# Patient Record
Sex: Female | Born: 1970 | Race: White | Hispanic: No | Marital: Single | Smoking: Current every day smoker
Health system: Southern US, Community
[De-identification: ages and names within clinical notes are randomized; demographics above are authoritative.]

## PROBLEM LIST (undated history)

## (undated) DIAGNOSIS — S2239XA Fracture of one rib, unspecified side, initial encounter for closed fracture: Secondary | ICD-10-CM

## (undated) DIAGNOSIS — F1910 Other psychoactive substance abuse, uncomplicated: Secondary | ICD-10-CM

## (undated) DIAGNOSIS — F1010 Alcohol abuse, uncomplicated: Secondary | ICD-10-CM

## (undated) DIAGNOSIS — W3400XA Accidental discharge from unspecified firearms or gun, initial encounter: Secondary | ICD-10-CM

## (undated) HISTORY — PX: FACIAL RECONSTRUCTION SURGERY: SHX631

---

## 2000-06-08 ENCOUNTER — Emergency Department (HOSPITAL_COMMUNITY): Admission: EM | Admit: 2000-06-08 | Discharge: 2000-06-08 | Payer: Self-pay | Admitting: Emergency Medicine

## 2000-06-08 ENCOUNTER — Encounter: Payer: Self-pay | Admitting: Emergency Medicine

## 2000-08-27 ENCOUNTER — Encounter: Payer: Self-pay | Admitting: Emergency Medicine

## 2000-08-27 ENCOUNTER — Emergency Department (HOSPITAL_COMMUNITY): Admission: EM | Admit: 2000-08-27 | Discharge: 2000-08-27 | Payer: Self-pay | Admitting: Emergency Medicine

## 2000-08-29 ENCOUNTER — Emergency Department (HOSPITAL_COMMUNITY): Admission: EM | Admit: 2000-08-29 | Discharge: 2000-08-29 | Payer: Self-pay | Admitting: *Deleted

## 2000-12-27 ENCOUNTER — Emergency Department (HOSPITAL_COMMUNITY): Admission: EM | Admit: 2000-12-27 | Discharge: 2000-12-27 | Payer: Self-pay | Admitting: Emergency Medicine

## 2001-02-27 ENCOUNTER — Inpatient Hospital Stay (HOSPITAL_COMMUNITY): Admission: RE | Admit: 2001-02-27 | Discharge: 2001-03-06 | Payer: Self-pay | Admitting: *Deleted

## 2002-03-21 ENCOUNTER — Emergency Department (HOSPITAL_COMMUNITY): Admission: EM | Admit: 2002-03-21 | Discharge: 2002-03-21 | Payer: Self-pay | Admitting: Emergency Medicine

## 2002-03-21 ENCOUNTER — Encounter: Payer: Self-pay | Admitting: Emergency Medicine

## 2002-06-24 ENCOUNTER — Encounter: Payer: Self-pay | Admitting: Internal Medicine

## 2002-06-24 ENCOUNTER — Ambulatory Visit (HOSPITAL_COMMUNITY): Admission: RE | Admit: 2002-06-24 | Discharge: 2002-06-24 | Payer: Self-pay | Admitting: Internal Medicine

## 2002-07-14 ENCOUNTER — Encounter: Payer: Self-pay | Admitting: *Deleted

## 2002-07-14 ENCOUNTER — Emergency Department (HOSPITAL_COMMUNITY): Admission: EM | Admit: 2002-07-14 | Discharge: 2002-07-14 | Payer: Self-pay | Admitting: *Deleted

## 2002-09-23 ENCOUNTER — Emergency Department (HOSPITAL_COMMUNITY): Admission: EM | Admit: 2002-09-23 | Discharge: 2002-09-23 | Payer: Self-pay | Admitting: Emergency Medicine

## 2003-02-28 ENCOUNTER — Emergency Department (HOSPITAL_COMMUNITY): Admission: EM | Admit: 2003-02-28 | Discharge: 2003-02-28 | Payer: Self-pay | Admitting: *Deleted

## 2003-07-21 ENCOUNTER — Emergency Department (HOSPITAL_COMMUNITY): Admission: EM | Admit: 2003-07-21 | Discharge: 2003-07-21 | Payer: Self-pay | Admitting: Emergency Medicine

## 2003-08-08 ENCOUNTER — Emergency Department (HOSPITAL_COMMUNITY): Admission: EM | Admit: 2003-08-08 | Discharge: 2003-08-08 | Payer: Self-pay | Admitting: Emergency Medicine

## 2003-08-17 ENCOUNTER — Emergency Department (HOSPITAL_COMMUNITY): Admission: EM | Admit: 2003-08-17 | Discharge: 2003-08-17 | Payer: Self-pay | Admitting: Emergency Medicine

## 2003-08-27 ENCOUNTER — Emergency Department (HOSPITAL_COMMUNITY): Admission: EM | Admit: 2003-08-27 | Discharge: 2003-08-27 | Payer: Self-pay | Admitting: Emergency Medicine

## 2004-01-06 ENCOUNTER — Emergency Department (HOSPITAL_COMMUNITY): Admission: EM | Admit: 2004-01-06 | Discharge: 2004-01-06 | Payer: Self-pay | Admitting: Emergency Medicine

## 2004-08-21 ENCOUNTER — Emergency Department (HOSPITAL_COMMUNITY): Admission: EM | Admit: 2004-08-21 | Discharge: 2004-08-21 | Payer: Self-pay | Admitting: Emergency Medicine

## 2009-12-22 ENCOUNTER — Ambulatory Visit (HOSPITAL_COMMUNITY)
Admission: RE | Admit: 2009-12-22 | Discharge: 2009-12-22 | Payer: Self-pay | Source: Home / Self Care | Attending: Family Medicine | Admitting: Family Medicine

## 2010-01-05 ENCOUNTER — Ambulatory Visit (HOSPITAL_COMMUNITY)
Admission: RE | Admit: 2010-01-05 | Discharge: 2010-01-05 | Payer: Self-pay | Source: Home / Self Care | Attending: Family Medicine | Admitting: Family Medicine

## 2010-02-27 ENCOUNTER — Emergency Department (HOSPITAL_COMMUNITY)
Admission: EM | Admit: 2010-02-27 | Discharge: 2010-02-27 | Disposition: A | Discharge status: Home / Self Care | Payer: Medicaid Other | Attending: Emergency Medicine | Admitting: Emergency Medicine

## 2010-02-27 DIAGNOSIS — K089 Disorder of teeth and supporting structures, unspecified: Secondary | ICD-10-CM | POA: Insufficient documentation

## 2010-05-20 NOTE — Discharge Summary (Signed)
[facility]  Patient:    Brooke Todd, Brooke Todd Visit Number: [id number] MRN: [id number]          Service Type: PSY Location: 300 [id number] [id number] Attending Physician:  Jeanice Lim Dictated by:   Jeanice Lim, M.D. Admit Date:  02/27/2001 Discharge Date: 03/06/2001                             Discharge Summary  IDENTIFYING DATA:  This is a 40 year old divorced Caucasian female with three children admitted with polysubstance abuse and suicidal thoughts with a plan to overdose.  MEDICATIONS:  None.  ALLERGIES:  PENICILLIN.  PHYSICAL EXAMINATION:  Essentially within normal limits.  Neurologically nonfocal.  LABORATORY DATA:  Routine admission labs essentially within normal limits. Urine drug screen positive for opiates, cocaine, marijuana and alcohol.  MENTAL STATUS EXAMINATION:  Slightly overweight Caucasian female, unkempt, appearing older than stated age.  Reporting auditory hallucinations, hearing the voice of her father.  Mood depressed.  Affect restricted.  Thought process goal directed.  Thought content with voices of her deceased father.  Otherwise no psychotic symptoms or dangerous ideation.  Cognition intact.  ADMISSION DIAGNOSES: Axis I:    1. Major depression, severe with psychotic features.            2. Polysubstance abuse.            3. Benzodiazepine dependence. Axis II:   None. Axis III:  Recurrent urinary tract infection. Axis IV:   Moderate (stressors with primary support group). Axis V:    30/55.  HOSPITAL COURSE:  The patient was admitted and ordered routine p.r.n. medications.  Restarted on Ativan, Lexapro, Restoril and Symmetrel for cocaine cravings.  The patient was placed on a phenobarbital detox protocol and monitored closely for withdrawal symptoms.  The patient tolerated medication changes and detox.  CONDITION ON DISCHARGE:  Markedly improved.  The patients mood was stable. No acute withdrawal symptoms.  No dangerous  ideation or psychotic symptoms. The patient reported motivation to be compliant with follow-up plans.  DISCHARGE MEDICATIONS: 1. Amantadine 100 mg b.i.d. 2. Lexapro 20 mg q.a.m. 3. BuSpar 10 mg q.i.d. 4. Trazodone 100 mg q.h.s.  FOLLOW-UP:  The patient was to follow up with the [facility] IOP.  DISCHARGE DIAGNOSES: Axis I:    1. Major depression, severe with psychotic features.            2. Polysubstance abuse.            3. Benzodiazepine dependence. Axis II:   None. Axis III:  Recurrent urinary tract infection. Axis IV:   Moderate (stressors with primary support group). Axis V:    Global Assessment of Functioning on discharge 55. Dictated by:   Jeanice Lim, M.D. Attending Physician:  Jeanice Lim DD:  04/17/01 TD:  04/18/01 Job: [id number]
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
## 2010-07-13 ENCOUNTER — Encounter: Payer: Self-pay | Admitting: Emergency Medicine

## 2010-07-13 ENCOUNTER — Emergency Department (HOSPITAL_COMMUNITY)
Admission: EM | Admit: 2010-07-13 | Discharge: 2010-07-13 | Disposition: A | Discharge status: Home / Self Care | Payer: Self-pay | Attending: Emergency Medicine | Admitting: Emergency Medicine

## 2010-07-13 DIAGNOSIS — IMO0002 Reserved for concepts with insufficient information to code with codable children: Secondary | ICD-10-CM | POA: Insufficient documentation

## 2010-07-13 MED ORDER — METHOCARBAMOL 500 MG PO TABS: ORAL_TABLET | ORAL | Status: DC

## 2010-07-13 MED ORDER — MELOXICAM 7.5 MG PO TABS: 7.5000 mg | ORAL_TABLET | Freq: Two times a day (BID) | ORAL | Status: DC
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
## 2010-07-19 NOTE — ED Provider Notes (Signed)
Medical screening examination/treatment/procedure(s) were performed by non-physician practitioner and as supervising physician I was immediately available for consultation/collaboration.  Joya Gaskins, MD 07/19/10 [phone]

## 2010-11-26 ENCOUNTER — Emergency Department (HOSPITAL_COMMUNITY)
Admission: EM | Admit: 2010-11-26 | Discharge: 2010-11-26 | Disposition: A | Discharge status: Home / Self Care | Payer: Self-pay | Attending: Emergency Medicine | Admitting: Emergency Medicine

## 2010-11-26 ENCOUNTER — Encounter (HOSPITAL_COMMUNITY): Payer: Self-pay | Admitting: *Deleted

## 2010-11-26 DIAGNOSIS — K0889 Other specified disorders of teeth and supporting structures: Secondary | ICD-10-CM

## 2010-11-26 DIAGNOSIS — K089 Disorder of teeth and supporting structures, unspecified: Secondary | ICD-10-CM | POA: Insufficient documentation

## 2010-11-26 DIAGNOSIS — K029 Dental caries, unspecified: Secondary | ICD-10-CM | POA: Insufficient documentation

## 2010-11-26 MED ORDER — HYDROCODONE-ACETAMINOPHEN 5-325 MG PO TABS: 2.0000 | ORAL_TABLET | ORAL | Status: AC | PRN

## 2010-11-26 MED ORDER — CLINDAMYCIN HCL 150 MG PO CAPS: 150.0000 mg | ORAL_CAPSULE | Freq: Three times a day (TID) | ORAL | Status: AC

## 2010-11-26 NOTE — ED Provider Notes (Signed)
Medical screening examination/treatment/procedure(s) were performed by non-physician practitioner and as supervising physician I was immediately available for consultation/collaboration.   Rolan Bucco, MD 11/26/10 [phone]

## 2010-11-26 NOTE — ED Notes (Signed)
Pt c/o tooth pain to right upper jaw. Pt states she has had pain for long time and has appt to have tooth removed.

## 2010-11-26 NOTE — ED Notes (Signed)
Pt a/ox4. Resp even and unlabored. NAD at this time. D/C instructions and Rx x2 reviewed with pt. Pt verbalized understanding. Pt ambulated to lobby with steady gate.  

## 2010-11-26 NOTE — ED Provider Notes (Signed)
History     CSN: [id number] Arrival date & time: 11/26/2010 10:52 AM   None     Chief Complaint  Patient presents with  . Dental Pain   HPI Brooke Todd is a 40 y.o. female who presents to the ED for dental pain. The pain started a few days ago but she has had the same pain in the past due to loss of a filling in the tooth. The pain is located in the upper right second molar. She rates the pain 8/10.  She has an appointment next week to have the tooth extracted. The history was provided by the patient.  History reviewed. No pertinent past medical history.  History reviewed. No pertinent past surgical history.  No family history on file.  History  Substance Use Topics  . Smoking status: Never Smoker   . Smokeless tobacco: Not on file  . Alcohol Use: No    OB History    Grav Para Term Preterm Abortions TAB SAB Ect Mult Living                  Review of Systems  HENT: Positive for ear pain and dental problem.   All other systems reviewed and are negative.    Allergies  Penicillins  Home Medications   Current Outpatient Rx  Name Route Sig Dispense Refill  . ARIPIPRAZOLE 5 MG PO TABS Oral Take 5 mg by mouth daily.      Marland Kitchen FLUOXETINE HCL 40 MG PO CAPS Oral Take 40 mg by mouth daily.      Marland Kitchen METHOCARBAMOL 500 MG PO TABS  2 tabs po tid for spasm 30 tablet 0    BP 117/84  Pulse 93  Temp 98.9 F (37.2 C)  Resp 18  Ht 5\' 8"  (1.727 m)  Wt 180 lb (81.647 kg)  BMI 27.37 kg/m2  SpO2 98%  LMP 10/30/2010  Physical Exam  Nursing note and vitals reviewed. Constitutional: She is oriented to person, place, and time. She appears well-developed and well-nourished. No distress.  HENT:  Head: Normocephalic.  Right Ear: Tympanic membrane is not erythematous.  Left Ear: Tympanic membrane is not erythematous.  Mouth/Throat: Oropharynx is clear and moist. No oral lesions. Dental caries present. No uvula swelling.    Eyes: EOM are normal.  Neck: Neck supple.    Pulmonary/Chest: Effort normal.  Abdominal: Soft. There is no tenderness.  Musculoskeletal: Normal range of motion.  Lymphadenopathy:    She has no cervical adenopathy.  Neurological: She is alert and oriented to person, place, and time. No cranial nerve deficit.  Skin: Skin is warm and dry.  Psychiatric: She has a normal mood and affect. Her behavior is normal. Judgment and thought content normal.   Assessment: Dental pain  Plan:  Clindamycin 150 mg po every 6 hours   Vicodin   Ibuprofen   Follow up as scheduled with the dentist ED Course  Procedures   MDM          Kerrie Buffalo, NP 11/26/10 1143

## 2011-04-02 ENCOUNTER — Encounter (HOSPITAL_COMMUNITY): Payer: Self-pay | Admitting: *Deleted

## 2011-04-02 ENCOUNTER — Emergency Department (HOSPITAL_COMMUNITY)
Admission: EM | Admit: 2011-04-02 | Discharge: 2011-04-02 | Disposition: A | Discharge status: Home / Self Care | Payer: No Typology Code available for payment source | Attending: Emergency Medicine | Admitting: Emergency Medicine

## 2011-04-02 ENCOUNTER — Emergency Department (HOSPITAL_COMMUNITY): Payer: No Typology Code available for payment source

## 2011-04-02 DIAGNOSIS — R079 Chest pain, unspecified: Secondary | ICD-10-CM | POA: Insufficient documentation

## 2011-04-02 DIAGNOSIS — F17200 Nicotine dependence, unspecified, uncomplicated: Secondary | ICD-10-CM | POA: Insufficient documentation

## 2011-04-02 DIAGNOSIS — R0781 Pleurodynia: Secondary | ICD-10-CM

## 2011-04-02 DIAGNOSIS — Z79899 Other long term (current) drug therapy: Secondary | ICD-10-CM | POA: Insufficient documentation

## 2011-04-02 MED ORDER — HYDROCODONE-ACETAMINOPHEN 5-325 MG PO TABS
1.0000 | ORAL_TABLET | Freq: Once | ORAL | Status: AC
  Administered 2011-04-02: 1 via ORAL
  Filled 2011-04-02: qty 1

## 2011-04-02 MED ORDER — NAPROXEN 500 MG PO TABS: 500.0000 mg | ORAL_TABLET | Freq: Two times a day (BID) | ORAL | Status: DC

## 2011-04-02 MED ORDER — HYDROCODONE-ACETAMINOPHEN 5-325 MG PO TABS: 1.0000 | ORAL_TABLET | Freq: Four times a day (QID) | ORAL | Status: AC | PRN
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
## 2011-05-17 ENCOUNTER — Emergency Department (HOSPITAL_COMMUNITY): Payer: Self-pay

## 2011-05-17 ENCOUNTER — Encounter (HOSPITAL_COMMUNITY): Payer: Self-pay

## 2011-05-17 ENCOUNTER — Emergency Department (HOSPITAL_COMMUNITY)
Admission: EM | Admit: 2011-05-17 | Discharge: 2011-05-17 | Disposition: A | Discharge status: Home / Self Care | Payer: Self-pay | Attending: Emergency Medicine | Admitting: Emergency Medicine

## 2011-05-17 DIAGNOSIS — IMO0001 Reserved for inherently not codable concepts without codable children: Secondary | ICD-10-CM | POA: Insufficient documentation

## 2011-05-17 DIAGNOSIS — R05 Cough: Secondary | ICD-10-CM | POA: Insufficient documentation

## 2011-05-17 DIAGNOSIS — J3489 Other specified disorders of nose and nasal sinuses: Secondary | ICD-10-CM | POA: Insufficient documentation

## 2011-05-17 DIAGNOSIS — R059 Cough, unspecified: Secondary | ICD-10-CM | POA: Insufficient documentation

## 2011-05-17 DIAGNOSIS — J029 Acute pharyngitis, unspecified: Secondary | ICD-10-CM | POA: Insufficient documentation

## 2011-05-17 DIAGNOSIS — J40 Bronchitis, not specified as acute or chronic: Secondary | ICD-10-CM | POA: Insufficient documentation

## 2011-05-17 DIAGNOSIS — R079 Chest pain, unspecified: Secondary | ICD-10-CM | POA: Insufficient documentation

## 2011-05-17 DIAGNOSIS — F17200 Nicotine dependence, unspecified, uncomplicated: Secondary | ICD-10-CM | POA: Insufficient documentation

## 2011-05-17 MED ORDER — AZITHROMYCIN 250 MG PO TABS: ORAL_TABLET | ORAL | Status: DC

## 2011-05-17 MED ORDER — PROMETHAZINE-CODEINE 6.25-10 MG/5ML PO SYRP: 5.0000 mL | ORAL_SOLUTION | ORAL | Status: AC | PRN

## 2011-05-17 NOTE — ED Notes (Signed)
Complain of cough and congestion. Also, states her right ribs hurt where she has been coughing so much
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
## 2011-05-17 NOTE — Discharge Instructions (Signed)
Bronchitis Bronchitis is a problem of the air tubes leading to your lungs. This problem makes it hard for air to get in and out of the lungs. You may cough a lot because your air tubes are narrow. Going without care can cause lasting (chronic) bronchitis. HOME CARE   Drink enough fluids to keep your pee (urine) clear or pale yellow.   Use a cool mist humidifier.   Quit smoking if you smoke. If you keep smoking, the bronchitis might not get better.   Only take medicine as told by your doctor.  GET HELP RIGHT AWAY IF:   Coughing keeps you awake.   You start to wheeze.   You become more sick or weak.   You have a hard time breathing or get short of breath.   You cough up blood.   Coughing lasts more than 2 weeks.   You have a fever.   Your baby is older than 3 months with a rectal temperature of 102 F (38.9 C) or higher.   Your baby is 8 months old or younger with a rectal temperature of 100.4 F (38 C) or higher.  MAKE SURE YOU:  Understand these instructions.   Will watch your condition.   Will get help right away if you are not doing well or get worse.  Document Released: 06/07/2007 Document Revised: 12/08/2010 Document Reviewed: 11/20/2008 [facility] Patient Information 2012 [place], [place].   Take the entire course of the antibiotic prescribed.  You may use the Phenergan with codeine if needed for cough relief, and this medication will make you drowsy do not drive within 4 hours of taking.  This should also help improve your pain with coughing as well.  Your chest x-ray is negative today for pneumonia.  You need to stop smoking.

## 2011-05-22 NOTE — ED Provider Notes (Signed)
Medical screening examination/treatment/procedure(s) were performed by non-physician practitioner and as supervising physician I was immediately available for consultation/collaboration.   Benny Lennert, MD 05/22/11 [phone]

## 2011-06-07 ENCOUNTER — Encounter (HOSPITAL_COMMUNITY): Payer: Self-pay

## 2011-06-07 ENCOUNTER — Emergency Department (HOSPITAL_COMMUNITY)
Admission: EM | Admit: 2011-06-07 | Discharge: 2011-06-07 | Disposition: A | Discharge status: Home / Self Care | Payer: Self-pay | Attending: Emergency Medicine | Admitting: Emergency Medicine

## 2011-06-07 DIAGNOSIS — F1910 Other psychoactive substance abuse, uncomplicated: Secondary | ICD-10-CM | POA: Insufficient documentation

## 2011-06-07 DIAGNOSIS — F1010 Alcohol abuse, uncomplicated: Secondary | ICD-10-CM | POA: Insufficient documentation

## 2011-06-07 DIAGNOSIS — F17200 Nicotine dependence, unspecified, uncomplicated: Secondary | ICD-10-CM | POA: Insufficient documentation

## 2011-06-07 HISTORY — DX: Accidental discharge from unspecified firearms or gun, initial encounter: W34.00XA

## 2011-06-07 HISTORY — DX: Other psychoactive substance abuse, uncomplicated: F19.10

## 2011-06-07 HISTORY — DX: Alcohol abuse, uncomplicated: F10.10

## 2011-06-07 HISTORY — DX: Fracture of one rib, unspecified side, initial encounter for closed fracture: S22.39XA

## 2011-06-07 LAB — CBC
MCV: 89.4 fL (ref 78.0–100.0)
RBC: 4.91 MIL/uL (ref 3.87–5.11)
RDW: 14.6 % (ref 11.5–15.5)
WBC: 9.9 10*3/uL (ref 4.0–10.5)

## 2011-06-07 LAB — COMPREHENSIVE METABOLIC PANEL
Albumin: 3.7 g/dL (ref 3.5–5.2)
Alkaline Phosphatase: 77 U/L (ref 39–117)
BUN: 6 mg/dL (ref 6–23)
CO2: 25 mEq/L (ref 19–32)
Creatinine, Ser: 0.81 mg/dL (ref 0.50–1.10)
GFR calc Af Amer: >90 mL/min (ref >90)

## 2011-06-07 LAB — DIFFERENTIAL
Basophils Absolute: 0 10*3/uL (ref 0.0–0.1)
Basophils Relative: 0 % (ref 0–1)

## 2011-06-07 LAB — RAPID URINE DRUG SCREEN, HOSP PERFORMED
Amphetamines: NOT DETECTED
Barbiturates: NOT DETECTED
Opiates: NOT DETECTED
Tetrahydrocannabinol: POSITIVE — AB

## 2011-06-07 NOTE — Discharge Instructions (Signed)
Read the information below.  Please use the resources available to you for help with your drug use.  If you feel that you would like help or feel that you are a danger to yourself or anyone else, please call 911 or return to the ER immediately.  You may return to the ER at any time for worsening condition or any new symptoms that concern you.   Polysubstance Abuse When people abuse more than one drug or type of drug it is called polysubstance or polydrug abuse. For example, many smokers also drink alcohol. This is one form of polydrug abuse. Polydrug abuse also refers to the use of a drug to counteract an unpleasant effect produced by another drug. It may also be used to help with withdrawal from another drug. People who take stimulants may become agitated. Sometimes this agitation is countered with a tranquilizer. This helps protect against the unpleasant side effects. Polydrug abuse also refers to the use of different drugs at the same time.  Anytime drug use is interfering with normal living activities, it has become abuse. This includes problems with family and friends. Psychological dependence has developed when your mind tells you that the drug is needed. This is usually followed by physical dependence which has developed when continuing increases of drug are required to get the same feeling or "high". This is known as addiction or chemical dependency. A person's risk is much higher if there is a history of chemical dependency in the family. SIGNS OF CHEMICAL DEPENDENCY  You have been told by friends or family that drugs have become a problem.   You fight when using drugs.   You are having blackouts (not remembering what you do while using).   You feel sick from using drugs but continue using.   You lie about use or amounts of drugs (chemicals) used.   You need chemicals to get you going.   You are suffering in work performance or in school because of drug use.   You get sick from use of  drugs but continue to use anyway.   You need drugs to relate to people or feel comfortable in social situations.   You use drugs to forget problems.  "Yes" answered to any of the above signs of chemical dependency indicates there are problems. The longer the use of drugs continues, the greater the problems will become. If there is a family history of drug or alcohol use, it is best not to experiment with these drugs. Continual use leads to tolerance. After tolerance develops more of the drug is needed to get the same feeling. This is followed by addiction. With addiction, drugs become the most important part of life. It becomes more important to take drugs than participate in the other usual activities of life. This includes relating to friends and family. Addiction is followed by dependency. Dependency is a condition where drugs are now needed not just to get high, but to feel normal. Addiction cannot be cured but it can be stopped. This often requires outside help and the care of professionals. Treatment centers are listed in the yellow pages under: Cocaine, Narcotics, and Alcoholics Anonymous. Most hospitals and clinics can refer you to a specialized care center. Talk to your caregiver if you need help. Document Released: 08/10/2004 Document Revised: 12/08/2010 Document Reviewed: 12/19/2004 [facility] Patient Information 2012 [place], [place].  If you have no primary doctor, here are some resources that may be helpful:  Medicaid-accepting [facility] Providers:   -  [facility]- [address]      [phone]      Mon-Fri 9am-7pm, Sat 9am-1pm   - [facility]- [address] [place], [place]      [phone]   - [facility]- [address] [place]      [phone]   [facility]- [address]      [phone]   - Renaye Rakers- [address] [place], [address]      [phone]      Only accepts [place] patients       after they have her name applied to their card   Self Pay (no insurance) in [place]:   - Sickle Cell Patients: Dr Willey Blade, [facility]      [address] [place]      [phone]   - [phone]   - Physician Referral Service- [phone]   - [facility]- [address] [place]      [phone]   [facility] [place]- [address]   - [facility]- see information above      (Speak to [employer] if you do not have insurance)   - [facility]- [address] [place]      [phone]   - [facility] [place]- [address]      [phone]   - [facility]- [address]      [phone]   - Dr Julio Sicks-  [address], [place]      [phone]   - [facility]- [address]      [phone]   - [facility]- [address]      [phone]      Also [address]      [phone]   - [facility]- [address]      [phone]      1st and 3rd Saturday every month, 10am-1pm Other agencies that provide inexpensive medical care:    [facility]  [phone]    [facility]  [phone]    [facility]  [phone]    [facility]  [phone]    [facility]  [phone]  General Information: Finding a doctor when you do not have health insurance can be tricky. Although you are not limited by an insurance plan, you are of course limited by her finances and how much but he can pay out of pocket.  What are your options if you don't have health insurance?   1) Find a Librarian, academic and Pay Out of Pocket Although you won't have to find out who is covered by your insurance plan, it is a good idea to ask around and get recommendations. You will then need to call the office and see if the doctor you have chosen will accept you as a new patient and what types of options they offer for  patients who are self-pay. Some doctors offer discounts or will set up payment plans for their patients who do not have insurance, but you will need to ask so you aren't surprised when you get to your appointment.  2) Contact Your Local Health Department Not all health departments have doctors that can see patients for sick visits, but  many do, so it is worth a call to see if yours does. If you don't know where your local health department is, you can check in your phone book. The CDC also has a tool to help you locate your state's health department, and many state websites also have listings of all of their local health departments.  3) Find a Walk-in Clinic If your illness is not likely to be very severe or complicated, you may want to try a walk in clinic. These are popping up all over the country in pharmacies, drugstores, and shopping centers. They're usually staffed by nurse practitioners or physician assistants that have been trained to treat common illnesses and complaints. They're usually fairly quick and inexpensive. However, if you have serious medical issues or chronic medical problems, these are probably not your best option   RESOURCE GUIDE  Chronic Pain Problems: Contact [facility]  [phone] Patients need to be referred by their primary care doctor.  Insufficient Money for Medicine: Contact United Way:  call "211" or [facility] [phone].  No Primary Care Doctor: - Call [facility] Connect  [phone] - can help you locate a primary care doctor that  accepts your insurance, provides certain services, etc. - Physician Referral Service- [phone]  Agencies that provide inexpensive medical care: - [facility]  [phone] - [facility]  [phone] - [facility]  [phone] - Women's Clinic  [phone] - [facility]  [phone] [facility]  [phone]  Medicaid-accepting [facility]  Providers: - [facility]- [address]  [phone], Mon-Fri 9am-7pm, Sat 9am-1pm - [facility]- [address] [place], [address] [place]  [phone] - [facility]- [address], [address] [place]  [phone] [facility]- [address]  [phone] - Renaye Rakers- [address] [place], [address], [phone]  Only accepts [place] patients after they have their name  applied to their card  Self Pay (no insurance) in [place]: - Sickle Cell Patients: Dr Willey Blade, [facility]  [address] [place], [phone] - [facility]- [address] [place]  [phone]       [facility] [place]- [address]       -     [facility]- see information above (Speak to [employer] if you do not have insurance)       -  [facility]- [address] [place], [phone]       -  [facility]- [address],  [phone]       -  [facility]- [address], [phone]       -  Dr Julio Sicks-  [address], [place], [phone]       -  [facility]- [address], [phone]       -  [facility]- [address], [phone], also [address] [phone]       -    [facility]- [address] [place], [phone], 1st & 3rd Saturday   every month, 10am-1pm  1) Find a Doctor and Pay Out of Pocket Although you won't have to find out who is covered by your insurance plan, it is a good idea to ask  around and get recommendations. You will then need to call the office and see if the doctor you have chosen will accept you as a new patient and what types of options they offer for patients who are self-pay. Some doctors offer discounts or will set up payment plans for their patients who do not have insurance, but you will need to ask so you aren't surprised when you get to your  appointment.  2) Contact Your Local Health Department Not all health departments have doctors that can see patients for sick visits, but many do, so it is worth a call to see if yours does. If you don't know where your local health department is, you can check in your phone book. The CDC also has a tool to help you locate your state's health department, and many state websites also have listings of all of their local health departments.  3) Find a Walk-in Clinic If your illness is not likely to be very severe or complicated, you may want to try a walk in clinic. These are popping up all over the country in pharmacies, drugstores, and shopping centers. They're usually staffed by nurse practitioners or physician assistants that have been trained to treat common illnesses and complaints. They're usually fairly quick and inexpensive. However, if you have serious medical issues or chronic medical problems, these are probably not your best option  STD Testing - [facility] Department of [facility], STD Clinic, [address] [place], phone [phone] or [phone].  Monday - Friday, call for an appointment. [facility] Department of [employer], STD Clinic, [address], [place], phone [phone] or [phone].  Monday - Friday, call for an appointment.  Abuse/Neglect: [facility] Child Abuse Hotline 848-588-4710 [facility] Child Abuse Hotline 916-458-3996 (After Hours)  Emergency Shelter:  [facility] 907-133-8635  Maternity Homes: - [facility] (709)[phone] - [facility] [phone]  MRSA Hotline #:   505-246-8767  [facility] Resources  [facility]  [employer] [facility] Dept. [address]                 [address]         [address] [place] [address]  Blondell Reveal Phone:  [phone]                                  Phone:  [phone]                   Phone:  [phone]  [facility] Mental Health, [phone] - [phone]       -     [facility] in [place], [address],  [phone], [facility] Child Abuse Hotline (938)784-0524 or 785-045-1492 (After Hours)   Behavioral Health Services  Substance Abuse Resources: - Alcohol and Drug Services  ([phone] - [facility] [phone] - [facility] [place] [phone] Floydene Flock [phone] - Residential & Outpatient Substance Abuse Program  [phone]  Psychological Services: [facility]  [phone] [facility] Services  [phone] - [facility], [phone] [place]. [address] [place], ACCESS LINE: 773-367-2896 or (269)[phone], [employer]  Dental Assistance  If unable to pay or uninsured, contact:  [facility] or [facility]. to become qualified for the adult dental clinic.  Patients with Medicaid: [facility] [phone] [address], [phone] [address], [phone]  If unable to pay, or uninsured, contact [facility] 5811984872) or [facility] Department [phone] in [place], [phone] in [facility]) to become qualified for the adult dental clinic  Other Low-Cost Community Dental Services: - [facility]- [address] [place], [place], [place], [address], [phone], [address], 2nd and 4th Thursday of the month at 6:30am.  10 clients each day by appointment, can sometimes see walk-in patients if someone does not show for an appointment. [facility]- [address] [place], [place], [address], [phone] - [facility]- [address], [place], [place], [address],  [phone] - [facility]- [phone] [facility]- [phone] [facility] Department- [phone]
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
## 2011-06-07 NOTE — ED Notes (Signed)
Pt. has 3 bags of belongings located on top of pt. lockers in triage

## 2011-06-07 NOTE — ED Notes (Signed)
Pt. and belongings both wanded by security 

## 2011-06-07 NOTE — ED Notes (Signed)
Patient reports that she has been feeling suicidal for the past 3-4 months.  Patient has red marks on bilateral arms. Patient states that she only attempts to cut herself when she argues with her boyfriend to get him to shut up.. patient denies trying to commit suicide in the past. paitent also admits to using crack. Patient last used crack 1 1/2 hours ago and last drank a 40 ounce beer prior to coming to the ED.

## 2011-06-07 NOTE — ED Notes (Signed)
Pt. in blue scrubs and red socks.  

## 2011-06-08 NOTE — ED Provider Notes (Signed)
Medical screening examination/treatment/procedure(s) were performed by non-physician practitioner and as supervising physician I was immediately available for consultation/collaboration.  Flint Melter, MD 06/08/11 [phone]

## 2011-09-23 IMAGING — CR DG THORACIC SPINE 2V
2 series · 2 of 2 positions shown · non-contrast
Comparison: None.

CLINICAL DATA: Back pain.  No injury.

THORACIC SPINE - 2 VIEW

[view not recorded (1 of 2)]
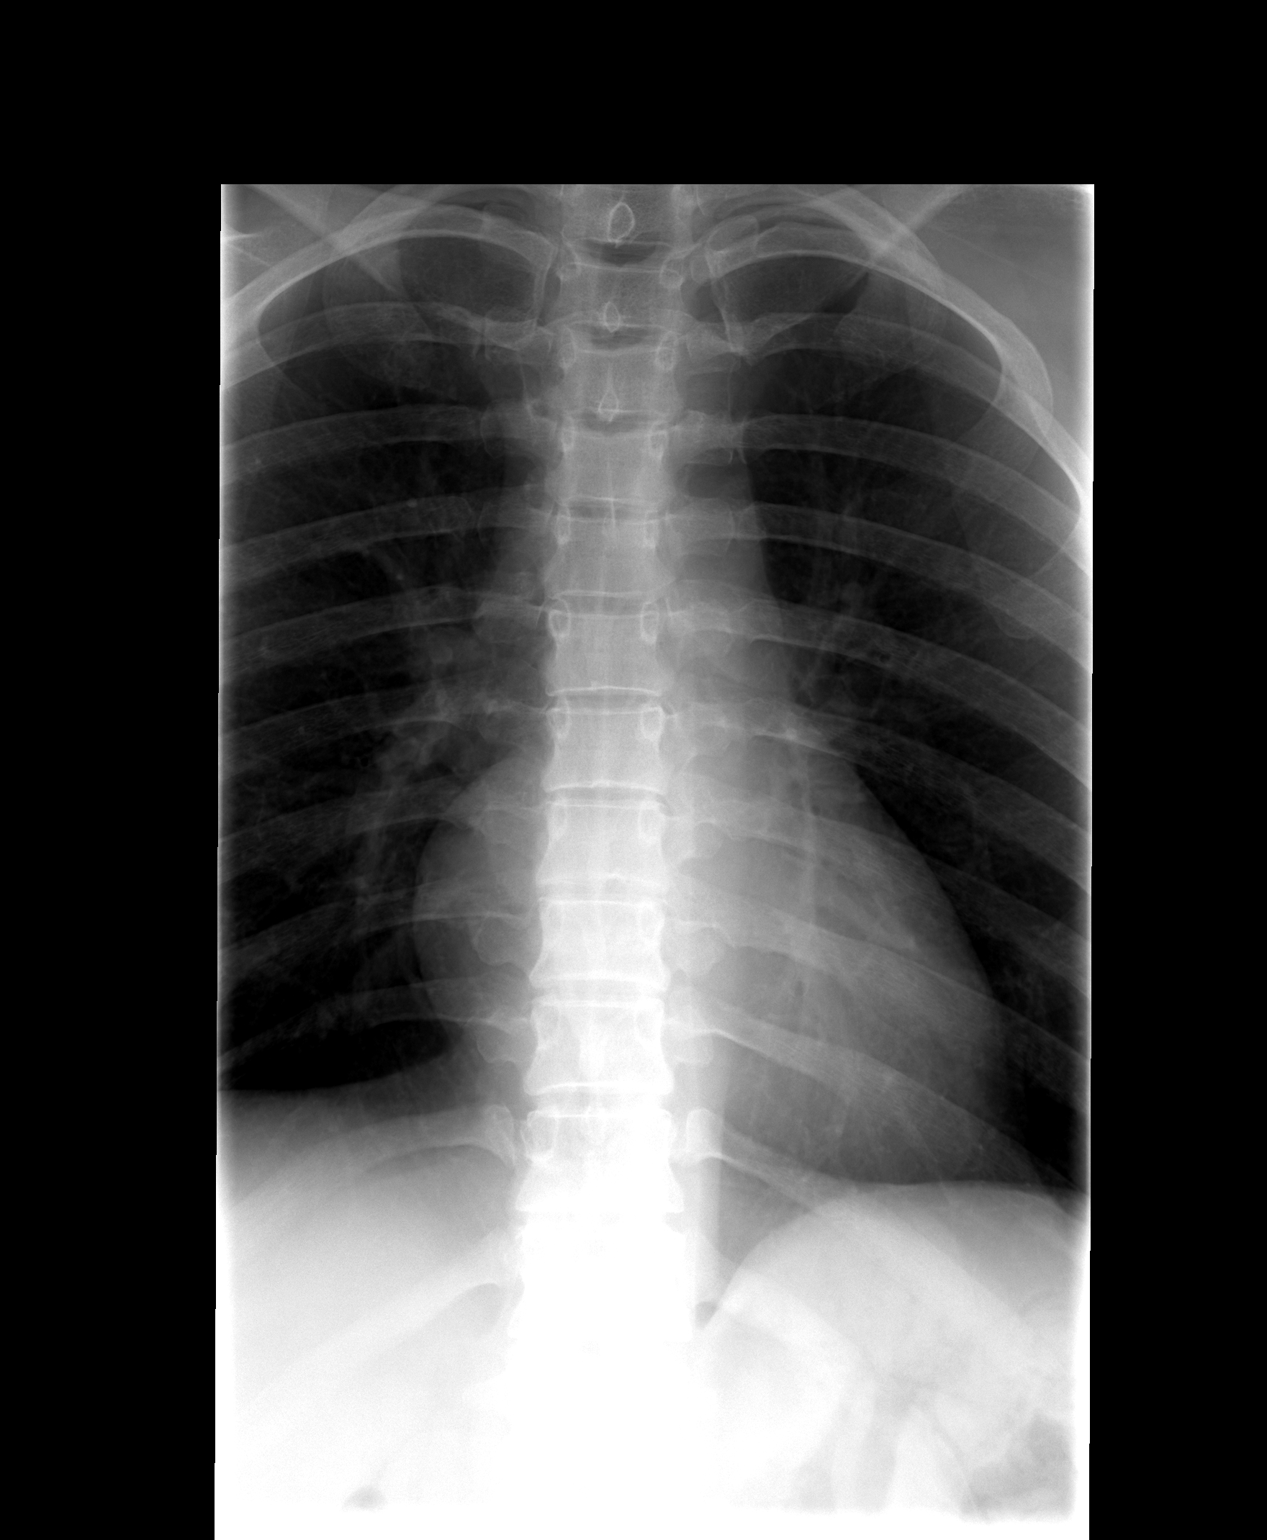

[view not recorded (2 of 2)]
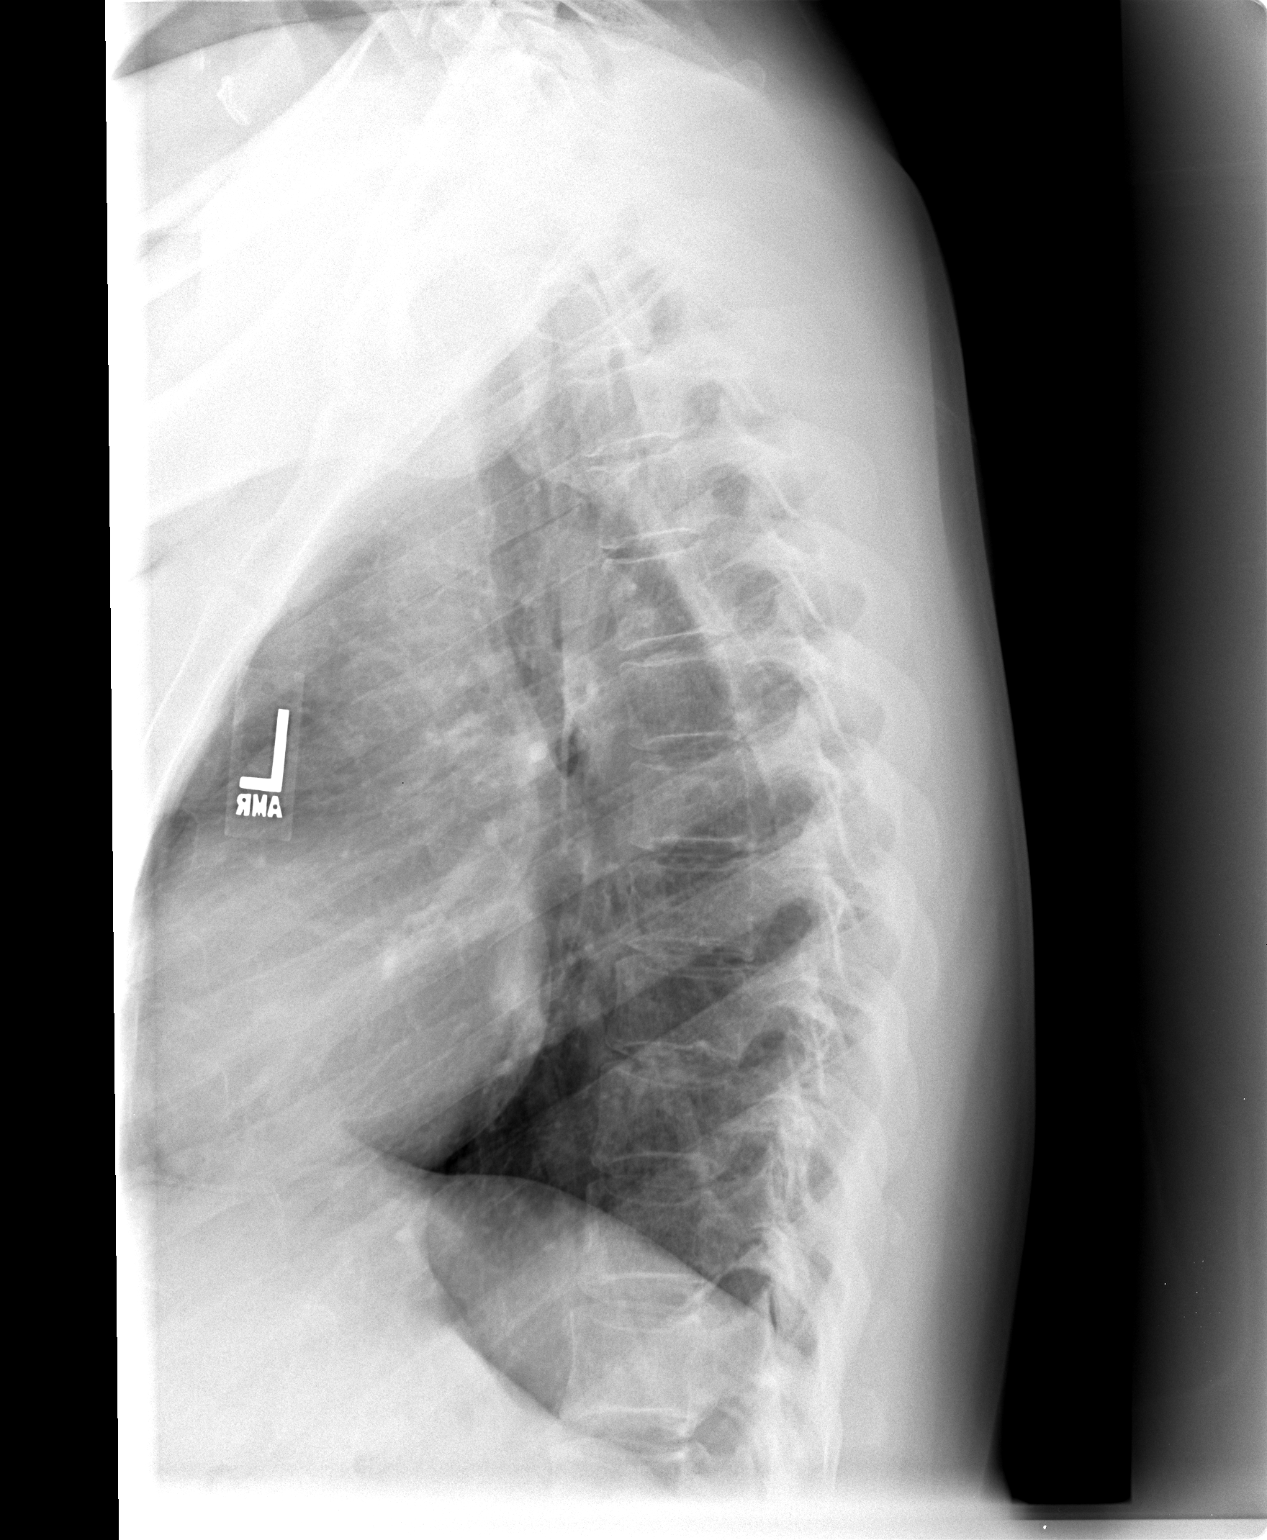

[2 of 2 positions shown; findings below may reference images not displayed]

FINDINGS: There is a subtle slight scoliosis curvature of the upper
thoracic spine convexity to the right.  This is very minimal.
Alignment is otherwise normal.  Intervertebral disc spaces are
maintained.  No fracture, subluxation, bony destruction, or
spondylosis is evident.
IMPRESSION: Subtle very slight scoliosis of the upper thoracic spine is very
minimal.  Normal otherwise.

## 2011-09-23 IMAGING — CR DG HIP (WITH OR WITHOUT PELVIS) 2-3V*L*
3 series · 3 of 3 positions shown · non-contrast
Comparison: None.

CLINICAL DATA: History of left hip pain and back pain.  No history
of recent injury.

LEFT HIP - COMPLETE 2+ VIEW

[view not recorded (1 of 3)]
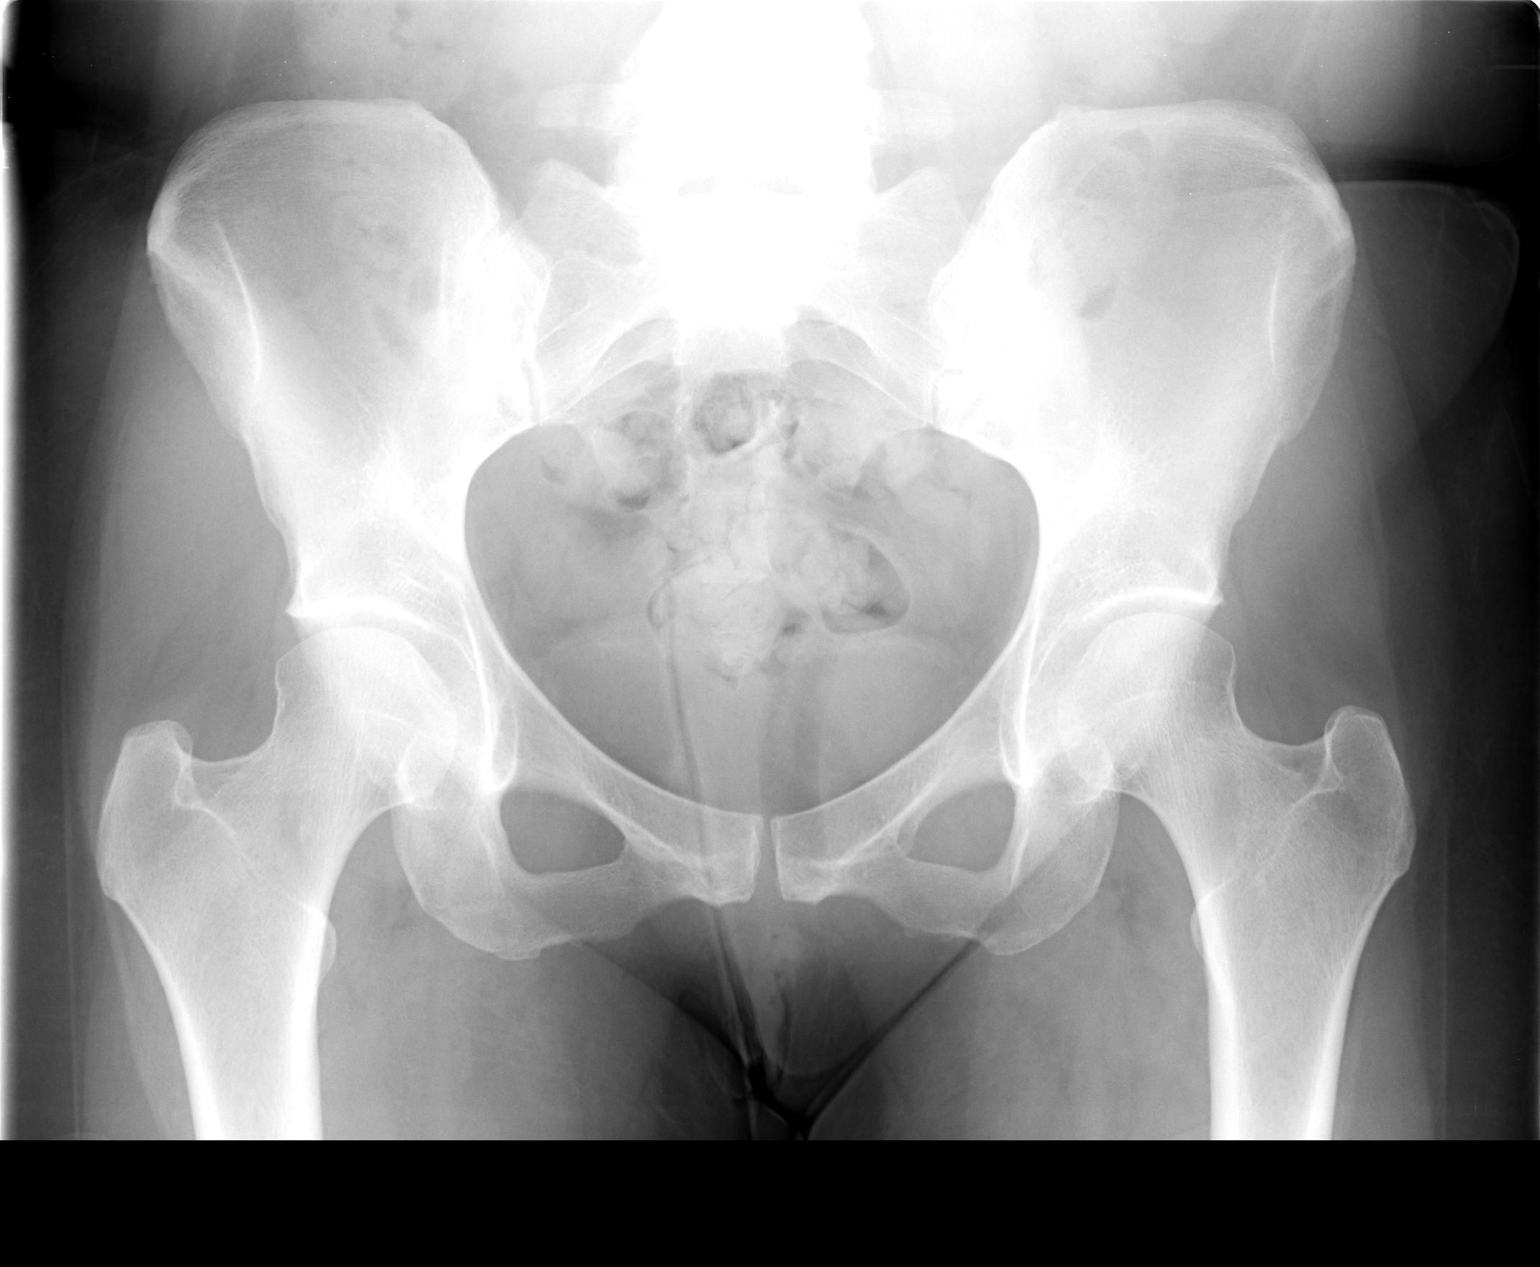

[view not recorded (2 of 3)]
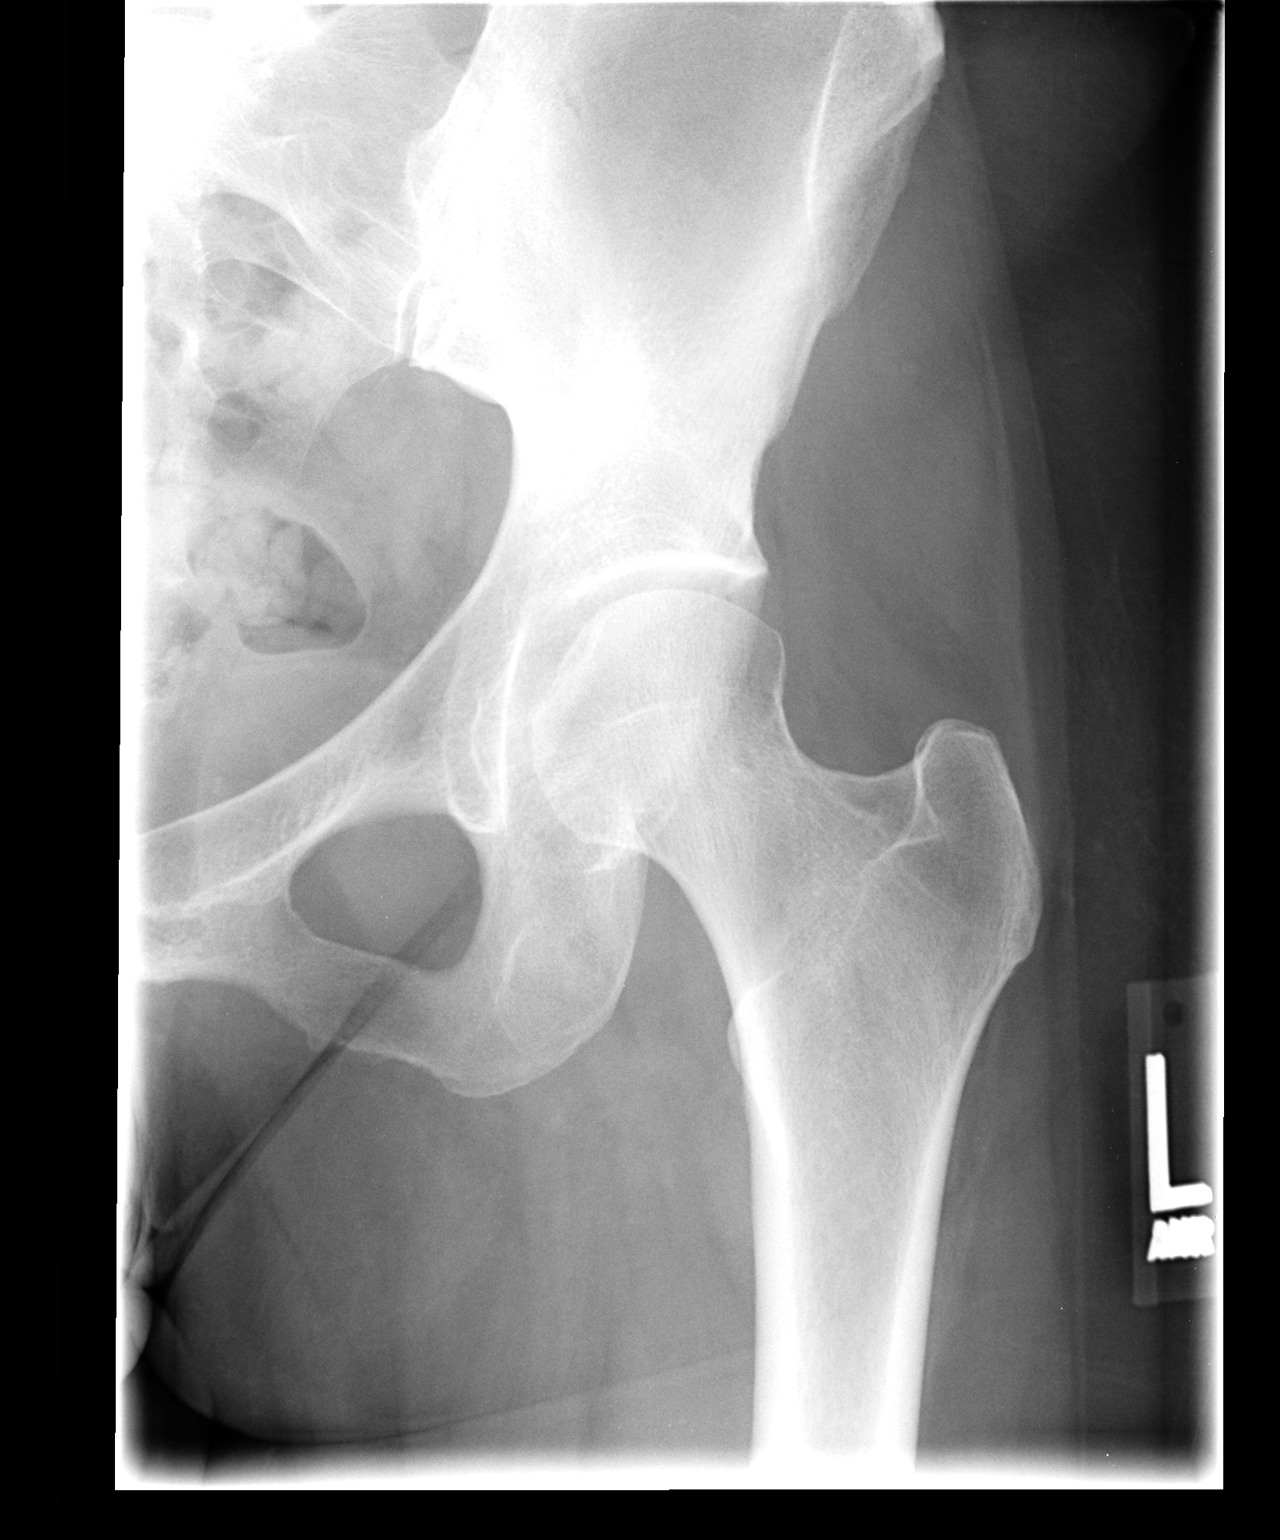

[view not recorded (3 of 3)]
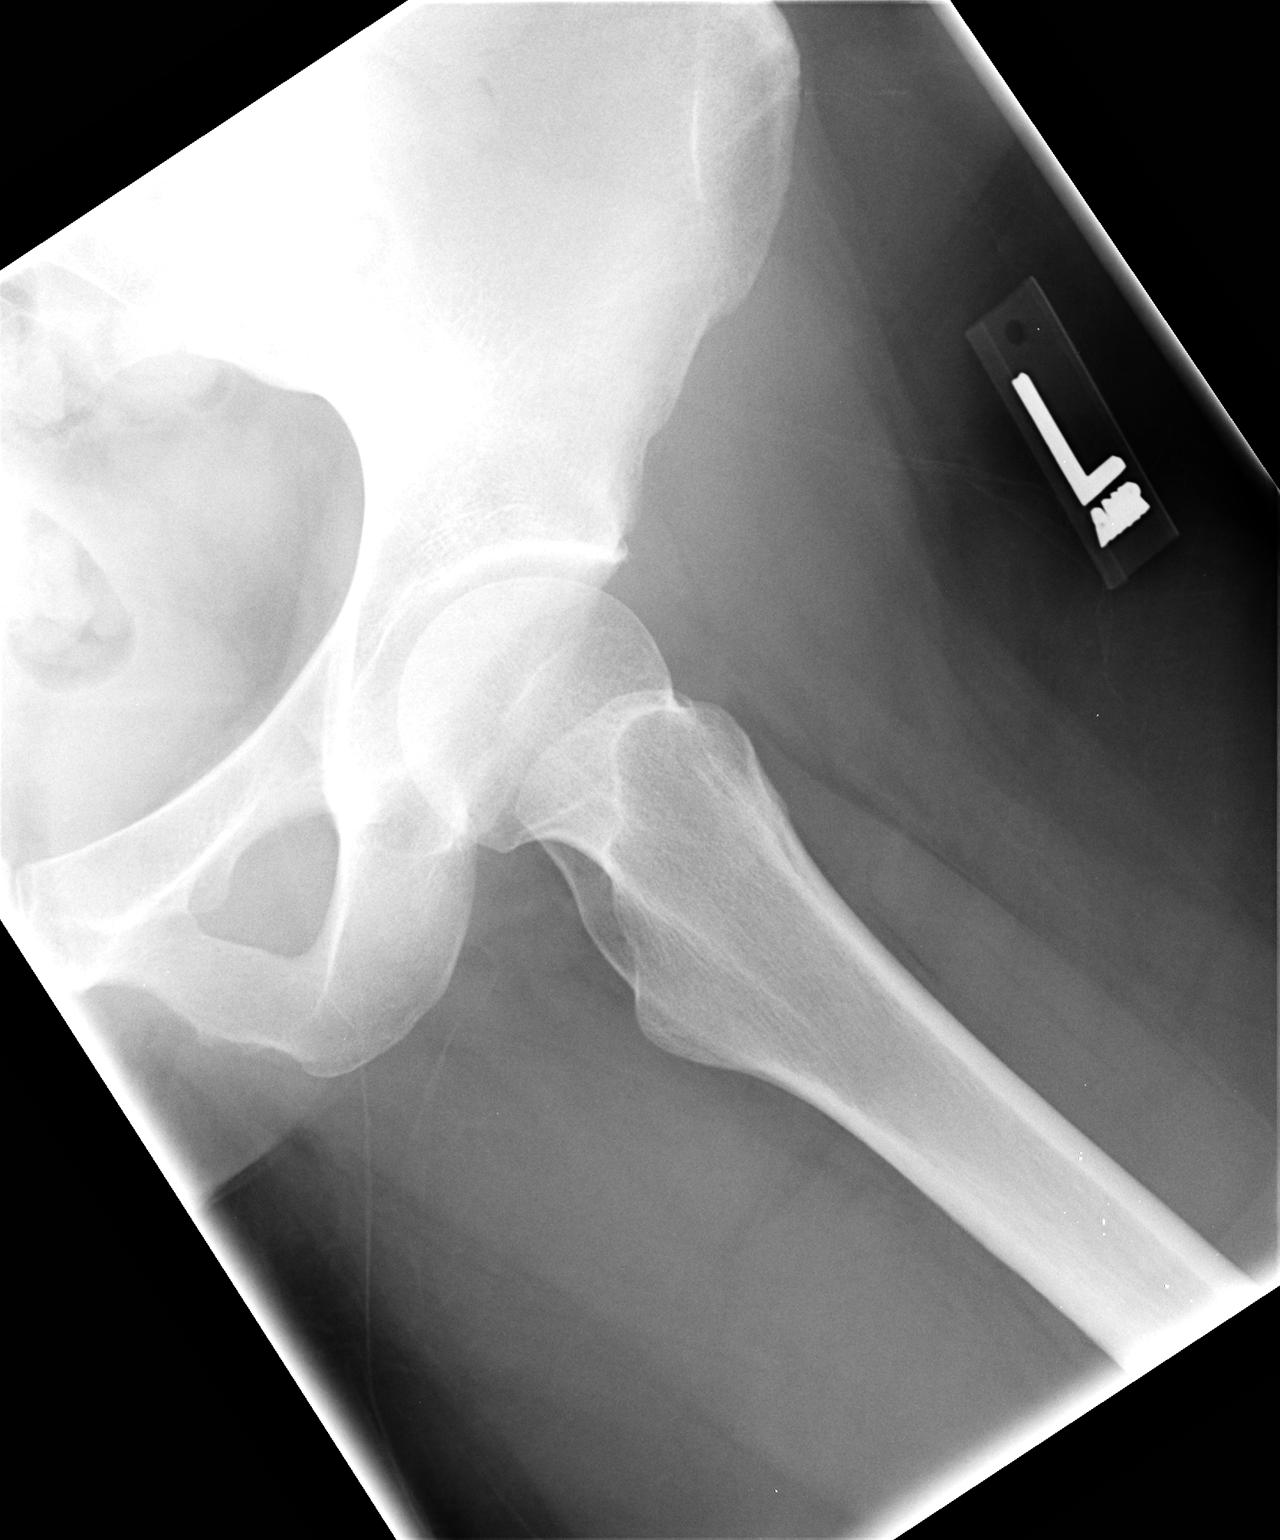

[3 of 3 positions shown; findings below may reference images not displayed]

FINDINGS: SI joints appear normal. Alignment is normal.  Joint
spaces are preserved.  No fracture or dislocation is evident.  No
soft tissue lesions are seen.  No calcific bursitis is evident.
IMPRESSION: Normal appearance of left hip.

## 2019-09-14 ENCOUNTER — Other Ambulatory Visit: Payer: Self-pay

## 2019-09-14 ENCOUNTER — Emergency Department (HOSPITAL_COMMUNITY)
Admission: EM | Admit: 2019-09-14 | Discharge: 2019-09-14 | Disposition: A | Discharge status: Home / Self Care | Payer: Self-pay | Attending: Emergency Medicine | Admitting: Emergency Medicine

## 2019-09-14 ENCOUNTER — Encounter (HOSPITAL_COMMUNITY): Payer: Self-pay | Admitting: Emergency Medicine

## 2019-09-14 ENCOUNTER — Emergency Department (HOSPITAL_COMMUNITY): Payer: Self-pay

## 2019-09-14 DIAGNOSIS — Y939 Activity, unspecified: Secondary | ICD-10-CM | POA: Insufficient documentation

## 2019-09-14 DIAGNOSIS — Y999 Unspecified external cause status: Secondary | ICD-10-CM | POA: Insufficient documentation

## 2019-09-14 DIAGNOSIS — Y9289 Other specified places as the place of occurrence of the external cause: Secondary | ICD-10-CM | POA: Insufficient documentation

## 2019-09-14 DIAGNOSIS — F17210 Nicotine dependence, cigarettes, uncomplicated: Secondary | ICD-10-CM | POA: Insufficient documentation

## 2019-09-14 DIAGNOSIS — M7712 Lateral epicondylitis, left elbow: Secondary | ICD-10-CM | POA: Insufficient documentation

## 2019-09-14 DIAGNOSIS — X58XXXA Exposure to other specified factors, initial encounter: Secondary | ICD-10-CM | POA: Insufficient documentation

## 2019-09-14 MED ORDER — NAPROXEN 500 MG PO TABS: 500.0000 mg | ORAL_TABLET | Freq: Two times a day (BID) | ORAL | 0 refills | Status: AC
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
## 2019-09-14 NOTE — Discharge Instructions (Addendum)
You may alternate taking Tylenol and Naproxen as needed for pain control. You may take Naproxen twice daily as directed on your discharge paperwork and you may take  [phone] mg of Tylenol every 6 hours. Do not exceed 4000 mg of Tylenol daily as this can lead to liver damage. Also, make sure to take Naproxen with meals as it can cause an upset stomach. Do not take other NSAIDs while taking Naproxen such as (Aleve, Ibuprofen, Aspirin, Celebrex, etc) and do not take more than the prescribed dose as this can lead to ulcers and bleeding in your GI tract. You may use warm and cold compresses to help with your symptoms.   You can buy a brace over-the-counter called a counterforce brace.  This can be bought at any drugstore.  Please follow up with your primary doctor or with the orthopedic doctor within the next 7-10 days for re-evaluation and further treatment of your symptoms.   Please return to the ER sooner if you have any new or worsening symptoms.

## 2019-09-14 NOTE — ED Triage Notes (Signed)
Pt c/o LT arm pain since she fell on it 2-3 weeks ago. No obvious deformity.

## 2019-09-22 ENCOUNTER — Other Ambulatory Visit: Payer: Self-pay

## 2019-09-22 ENCOUNTER — Emergency Department (HOSPITAL_COMMUNITY)
Admission: EM | Admit: 2019-09-22 | Discharge: 2019-09-22 | Disposition: A | Discharge status: Home / Self Care | Payer: Self-pay | Attending: Emergency Medicine | Admitting: Emergency Medicine

## 2019-09-22 DIAGNOSIS — Z5321 Procedure and treatment not carried out due to patient leaving prior to being seen by health care provider: Secondary | ICD-10-CM | POA: Insufficient documentation

## 2019-09-22 DIAGNOSIS — R1110 Vomiting, unspecified: Secondary | ICD-10-CM | POA: Insufficient documentation

## 2019-09-22 NOTE — ED Triage Notes (Signed)
Emesis x 7 hrs. States she had her first Covid shot last week.

## 2019-09-22 NOTE — ED Notes (Signed)
Pt decided to leave in the middle of triage after asking if she was "looking at a long wait". Pt notified that the longest person waiting had been here 7 hrs. Pt stated "we might as well stop this right here because I am not waiting".

## 2020-10-31 ENCOUNTER — Encounter (HOSPITAL_COMMUNITY): Payer: Self-pay | Admitting: Emergency Medicine

## 2020-10-31 ENCOUNTER — Emergency Department (HOSPITAL_COMMUNITY)
Admission: EM | Admit: 2020-10-31 | Discharge: 2020-10-31 | Disposition: A | Discharge status: Home / Self Care | Attending: Emergency Medicine | Admitting: Emergency Medicine

## 2020-10-31 DIAGNOSIS — S41112A Laceration without foreign body of left upper arm, initial encounter: Secondary | ICD-10-CM | POA: Diagnosis not present

## 2020-10-31 DIAGNOSIS — F17210 Nicotine dependence, cigarettes, uncomplicated: Secondary | ICD-10-CM | POA: Diagnosis not present

## 2020-10-31 DIAGNOSIS — W260XXA Contact with knife, initial encounter: Secondary | ICD-10-CM | POA: Diagnosis not present

## 2020-10-31 DIAGNOSIS — S4992XA Unspecified injury of left shoulder and upper arm, initial encounter: Secondary | ICD-10-CM | POA: Diagnosis present

## 2020-10-31 MED ORDER — LIDOCAINE-EPINEPHRINE 2 %-1:100000 IJ SOLN
20.0000 mL | Freq: Once | INTRAMUSCULAR | Status: AC
  Administered 2020-10-31: 20 mL via INTRADERMAL
  Filled 2020-10-31: qty 1

## 2020-10-31 MED ORDER — TETANUS-DIPHTH-ACELL PERTUSSIS 5-2.5-18.5 LF-MCG/0.5 IM SUSY: 0.5000 mL | PREFILLED_SYRINGE | Freq: Once | INTRAMUSCULAR | Status: DC
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
## 2020-10-31 NOTE — ED Notes (Signed)
PT states last tetanus in 2019. PA aware.
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
## 2020-10-31 NOTE — Discharge Instructions (Signed)
Please follow-up for suture removal at either urgent care ,the emergency department, or your primary care doctor in 7-10 days.  Please return to the emergency room immediately if you experience any new or worsening symptoms or any symptoms that indicate worsening infection such as fevers, increased redness/swelling/pain, warmth, or drainage from the affected area.   

## 2021-01-13 ENCOUNTER — Ambulatory Visit
Admission: EM | Admit: 2021-01-13 | Discharge: 2021-01-13 | Disposition: A | Discharge status: Home / Self Care | Attending: Urgent Care | Admitting: Urgent Care

## 2021-01-13 ENCOUNTER — Other Ambulatory Visit: Payer: Self-pay

## 2021-01-13 DIAGNOSIS — T192XXA Foreign body in vulva and vagina, initial encounter: Secondary | ICD-10-CM | POA: Insufficient documentation

## 2021-01-13 DIAGNOSIS — N898 Other specified noninflammatory disorders of vagina: Secondary | ICD-10-CM | POA: Insufficient documentation

## 2021-01-13 DIAGNOSIS — R102 Pelvic and perineal pain: Secondary | ICD-10-CM | POA: Insufficient documentation

## 2021-01-13 MED ORDER — FLUCONAZOLE 150 MG PO TABS
150.0000 mg | ORAL_TABLET | ORAL | 0 refills | Status: AC
Start: 2021-01-13 — End: ?

## 2021-01-13 MED ORDER — METRONIDAZOLE 500 MG PO TABS: 500.0000 mg | ORAL_TABLET | Freq: Two times a day (BID) | ORAL | 0 refills | Status: AC
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
## 2021-01-13 NOTE — Discharge Instructions (Signed)
Will have you start metronidazole and fluconazole to address bacterial vaginosis and yeast vaginitis which are infections that can come from having a retained foreign body. Testing is pending.

## 2021-01-14 LAB — CERVICOVAGINAL ANCILLARY ONLY
Bacterial Vaginitis (gardnerella): NEGATIVE
Candida Glabrata: NEGATIVE
Candida Vaginitis: NEGATIVE
Chlamydia: NEGATIVE
Comment: NEGATIVE
Comment: NEGATIVE
Comment: NEGATIVE
Comment: NEGATIVE
Comment: NEGATIVE
Comment: NORMAL
Neisseria Gonorrhea: NEGATIVE
Trichomonas: NEGATIVE
